# Patient Record
Sex: Male | Born: 1970 | Race: White | Hispanic: No | Smoking: Never smoker
Health system: Southern US, Community
[De-identification: ages and names within clinical notes are randomized; demographics above are authoritative.]

## PROBLEM LIST (undated history)

## (undated) DIAGNOSIS — E119 Type 2 diabetes mellitus without complications: Secondary | ICD-10-CM

## (undated) DIAGNOSIS — Z9989 Dependence on other enabling machines and devices: Secondary | ICD-10-CM

---

## 2015-08-29 ENCOUNTER — Encounter (HOSPITAL_BASED_OUTPATIENT_CLINIC_OR_DEPARTMENT_OTHER): Payer: Self-pay | Admitting: Emergency Medicine

## 2015-08-29 ENCOUNTER — Emergency Department (HOSPITAL_BASED_OUTPATIENT_CLINIC_OR_DEPARTMENT_OTHER)
Admission: EM | Admit: 2015-08-29 | Discharge: 2015-08-29 | Disposition: A | Discharge status: Home / Self Care | Payer: BLUE CROSS/BLUE SHIELD | Attending: Emergency Medicine | Admitting: Emergency Medicine

## 2015-08-29 DIAGNOSIS — H109 Unspecified conjunctivitis: Secondary | ICD-10-CM | POA: Insufficient documentation

## 2015-08-29 DIAGNOSIS — X58XXXA Exposure to other specified factors, initial encounter: Secondary | ICD-10-CM | POA: Insufficient documentation

## 2015-08-29 DIAGNOSIS — Y9289 Other specified places as the place of occurrence of the external cause: Secondary | ICD-10-CM | POA: Insufficient documentation

## 2015-08-29 DIAGNOSIS — Y998 Other external cause status: Secondary | ICD-10-CM | POA: Diagnosis not present

## 2015-08-29 DIAGNOSIS — S0502XA Injury of conjunctiva and corneal abrasion without foreign body, left eye, initial encounter: Secondary | ICD-10-CM | POA: Diagnosis not present

## 2015-08-29 DIAGNOSIS — Y9389 Activity, other specified: Secondary | ICD-10-CM | POA: Insufficient documentation

## 2015-08-29 DIAGNOSIS — S0592XA Unspecified injury of left eye and orbit, initial encounter: Secondary | ICD-10-CM | POA: Diagnosis present

## 2015-08-29 MED ORDER — POLYMYXIN B-TRIMETHOPRIM 10000-0.1 UNIT/ML-% OP SOLN: 2.0000 [drp] | Freq: Once | OPHTHALMIC | Status: DC

## 2015-08-29 MED ORDER — FLUORESCEIN SODIUM 1 MG OP STRP
1.0000 | ORAL_STRIP | Freq: Once | OPHTHALMIC | Status: DC
  Filled 2015-08-29: qty 1

## 2015-08-29 MED ORDER — TETRACAINE HCL 0.5 % OP SOLN
2.0000 [drp] | Freq: Once | OPHTHALMIC | Status: DC
Start: 2015-08-29 — End: 2015-08-29
  Filled 2015-08-29: qty 4

## 2015-08-29 MED ORDER — FLUORESCEIN SODIUM 1 MG OP STRP
ORAL_STRIP | OPHTHALMIC | Status: AC
  Filled 2015-08-29: qty 1

## 2015-08-29 MED ORDER — POLYMYXIN B-TRIMETHOPRIM 10000-0.1 UNIT/ML-% OP SOLN: 1.0000 [drp] | OPHTHALMIC | Status: AC

## 2015-08-29 NOTE — ED Provider Notes (Signed)
CSN: [id number]     Arrival date & time 08/29/15  1600 History   First MD Initiated Contact with Patient 08/29/15 1609     Chief Complaint  Patient presents with  . Eye Problem     (Consider location/radiation/quality/duration/timing/severity/associated sxs/prior Treatment) HPI  Blood pressure 125/89, pulse 89, temperature 98.6 F (37 C), temperature source Oral, resp. rate 18, height 5\' 11"  (1.803 m), weight 136.079 kg, SpO2 95 %.  Damon KleinMicheal Gomez is a 45 y.o. male complaining of bilateral eye redness and irritation worse on the left than the right associated with a prodrome of upper respiratory infection. Patient is not a contact lens wearer. He denies change in his vision, fever, pain with eye movement, trauma. States that he has been rubbing the left eye due to foreign body sensation.  History reviewed. No pertinent past medical history. History reviewed. No pertinent past surgical history. History reviewed. No pertinent family history. Social History  Substance Use Topics  . Smoking status: Never Smoker   . Smokeless tobacco: None  . Alcohol Use: Yes     Comment: occasional    Review of Systems  10 systems reviewed and found to be negative, except as noted in the HPI.  Allergies  Review of patient's allergies indicates no known allergies.  Home Medications   Prior to Admission medications   Not on File   BP 125/89 mmHg  Pulse 89  Temp(Src) 98.6 F (37 C) (Oral)  Resp 18  Ht 5\' 11"  (1.803 m)  Wt 136.079 kg  BMI 41.86 kg/m2  SpO2 95% Physical Exam  Constitutional: He is oriented to person, place, and time. He appears well-developed and well-nourished. No distress.  HENT:  Head: Normocephalic.  Eyes: EOM are normal. Pupils are equal, round, and reactive to light.    Mild bilateral conjunctival injection worse on the left than the right, no discharge. Patient has 2 areas of abnormal fluorescein uptake on the left eye as diagrammed. Extraocular movement is  intact without pain or diplopia. No lid or lash changes.  Cardiovascular: Normal rate.   Pulmonary/Chest: Effort normal. No stridor.  Musculoskeletal: Normal range of motion.  Neurological: He is alert and oriented to person, place, and time.  Psychiatric: He has a normal mood and affect.  Nursing note and vitals reviewed.   ED Course  Procedures (including critical care time) Labs Review Labs Reviewed - No data to display  Imaging Review No results found. I have personally reviewed and evaluated these images and lab results as part of my medical decision-making.   EKG Interpretation None      MDM   Final diagnoses:  Corneal abrasion, left, initial encounter  Conjunctivitis of left eye    Filed Vitals:   08/29/15 1607  BP: 125/89  Pulse: 89  Temp: 98.6 F (37 C)  TempSrc: Oral  Resp: 18  Height: 5\' 11"  (1.803 m)  Weight: 136.079 kg  SpO2: 95%    Medications  tetracaine (PONTOCAINE) 0.5 % ophthalmic solution 2 drop (not administered)  fluorescein ophthalmic strip 1 strip (not administered)  fluorescein 1 MG ophthalmic strip (not administered)  trimethoprim-polymyxin b (POLYTRIM) ophthalmic solution 2 drop (not administered)    Damon Gomez is 45 y.o. male presenting with bilateral eye redness and irritation. 2 small corneal abrasions on left eye under fluorescein stain. He is not a contact lens wearer. There is no signs of orbital or preseptal cellulitis.  Evaluation does not show pathology that would require ongoing emergent intervention or inpatient treatment.  Pt is hemodynamically stable and mentating appropriately. Discussed findings and plan with patient/guardian, who agrees with care plan. All questions answered. Return precautions discussed and outpatient follow up given.   New Prescriptions   TRIMETHOPRIM-POLYMYXIN B (POLYTRIM) OPHTHALMIC SOLUTION    Place 1 drop into both eyes every 4 (four) hours.         Wynetta Emery, PA-C 08/29/15  1637  Gwyneth Sprout, MD 08/29/15 2036

## 2015-08-29 NOTE — Discharge Instructions (Signed)
Wash your hands frequently and try to keep your hands away from the affected eye(s).   You should be feeling some improvement by 48 hours. If symptoms worsen, you develop pain, change in your vision or no improvement in 48 hours please follow with the ophthalmologist or, if that is not possible, return to the emergency room for a recheck.  Do not hesitate to return to the emergency room for any new, worsening or concerning symptoms.  Please obtain primary care using resource guide below. Let them know that you were seen in the emergency room and that they will need to obtain records for further outpatient management.    Corneal Abrasion The cornea is the clear covering at the front and center of the eye. When looking at the colored portion of the eye (iris), you are looking through the cornea. This very thin tissue is made up of many layers. The surface layer is a single layer of cells (corneal epithelium) and is one of the most sensitive tissues in the body. If a scratch or injury causes the corneal epithelium to come off, it is called a corneal abrasion. If the injury extends to the tissues below the epithelium, the condition is called a corneal ulcer. CAUSES   Scratches.  Trauma.  Foreign body in the eye. Some people have recurrences of abrasions in the area of the original injury even after it has healed (recurrent erosion syndrome). Recurrent erosion syndrome generally improves and goes away with time. SYMPTOMS   Eye pain.  Difficulty or inability to keep the injured eye open.  The eye becomes very sensitive to light.  Recurrent erosions tend to happen suddenly, first thing in the morning, usually after waking up and opening the eye. DIAGNOSIS  Your health care provider can diagnose a corneal abrasion during an eye exam. Dye is usually placed in the eye using a drop or a small paper strip moistened by your tears. When the eye is examined with a special light, the abrasion shows up  clearly because of the dye. TREATMENT   Small abrasions may be treated with antibiotic drops or ointment alone.  A pressure patch may be put over the eye. If this is done, follow your doctor's instructions for when to remove the patch. Do not drive or use machines while the eye patch is on. Judging distances is hard to do with a patch on. If the abrasion becomes infected and spreads to the deeper tissues of the cornea, a corneal ulcer can result. This is serious because it can cause corneal scarring. Corneal scars interfere with light passing through the cornea and cause a loss of vision in the involved eye. HOME CARE INSTRUCTIONS  Use medicine or ointment as directed. Only take over-the-counter or prescription medicines for pain, discomfort, or fever as directed by your health care provider.  Do not drive or operate machinery if your eye is patched. Your ability to judge distances is impaired.  If your health care provider has given you a follow-up appointment, it is very important to keep that appointment. Not keeping the appointment could result in a severe eye infection or permanent loss of vision. If there is any problem keeping the appointment, let your health care provider know. SEEK MEDICAL CARE IF:   You have pain, light sensitivity, and a scratchy feeling in one eye or both eyes.  Your pressure patch keeps loosening up, and you can blink your eye under the patch after treatment.  Any kind of discharge develops  from the eye after treatment or if the lids stick together in the morning.  You have the same symptoms in the morning as you did with the original abrasion days, weeks, or months after the abrasion healed.   This information is not intended to replace advice given to you by your health care provider. Make sure you discuss any questions you have with your health care provider.   Document Released: 08/05/2000 Document Revised: 04/29/2015 Document Reviewed: 04/15/2013 Elsevier  Interactive Patient Education 2016 [employer]  Emergency Department Resource Guide 1) Find a Doctor and Pay Out of Pocket Although you won't have to find out who is covered by your insurance plan, it is a good idea to ask around and get recommendations. You will then need to call the office and see if the doctor you have chosen will accept you as a new patient and what types of options they offer for patients who are self-pay. Some doctors offer discounts or will set up payment plans for their patients who do not have insurance, but you will need to ask so you aren't surprised when you get to your appointment.  2) Contact Your Local Health Department Not all health departments have doctors that can see patients for sick visits, but many do, so it is worth a call to see if yours does. If you don't know where your local health department is, you can check in your phone book. The CDC also has a tool to help you locate your state's health department, and many state websites also have listings of all of their local health departments.  3) Find a Walk-in Clinic If your illness is not likely to be very severe or complicated, you may want to try a walk in clinic. These are popping up all over the country in pharmacies, drugstores, and shopping centers. They're usually staffed by nurse practitioners or physician assistants that have been trained to treat common illnesses and complaints. They're usually fairly quick and inexpensive. However, if you have serious medical issues or chronic medical problems, these are probably not your best option.  No Primary Care Doctor: - Call Health Connect at  [phone] - they can help you locate a primary care doctor that  accepts your insurance, provides certain services, etc. - Physician Referral Service- [phone]  Chronic Pain Problems: Organization         Address  Phone   Notes  [facility]  [phone] Patients need to be referred by  their primary care doctor.   Medication Assistance: Organization         Address  Phone   Notes  [facility] [place] Medication [facility] [address] [place], [place] [address] [phone] --Must be a resident of [place] -- Must have NO insurance coverage whatsoever (no Medicaid/ Medicare, etc.) -- The pt. MUST have a primary care doctor that directs their care regularly and follows them in the community   MedAssist  [phone]   [employer]  [phone]    Agencies that provide inexpensive medical care: Organization         Address  Phone   Notes  [facility]  [phone]   [facility]    [phone]   [facility] [address] [place], [place] [address] [phone]   [facility] [address], [place] [phone]   [facility]anned Parenthood    [phone]   [facility]    [phone]   [facility] and [facility] [place]  [address], [place] Phone:  [phone], Fax:  [phone] Hours of Operation:  9 am - 6 pm, M-F.  Also accepts Medicaid/Medicare and self-pay.  [facility]  [address],  Phone: [phone], Fax: [phone]. Hours of Operation:  8:30 am - 5:30 pm, M-F.  Also accepts Medicaid and self-pay.  [facility] [place] [address] [place] Phone: [phone]   [facility] [address] Natasha Bence [place], [place] [phone], Ext. [phone] Mondays & Thursdays: 7-9 AM.  First 15 patients are seen on a first come, first serve basis.    Medicaid-accepting [facility] Providers:  Organization         Address  Phone   Notes  [facility] [address],  [phone] Also accepts self-pay patients.  [facility] [address] Laurell Josephs [place], [place]  [phone]   [facility]  [address], [place] [phone]   [facility] [address] [place] [phone]   Renaye Rakers [address], [place]   [phone] Only accepts [place] Access [place] patients after they have their name applied to their card.   Self-Pay (no insurance) in [facility]:  Organization         Address  Phone   Notes  Sickle Cell Patients, [facility] [address] [place], [place] [phone]   [facility] [address] [place], [place] [phone]   [facility]  [address], [place] [phone]   [facility]/Dr. Osei-Bonsu  [address] [place] or [address], [place] [phone] Phone number for both [place] and [place] locations is the same.  Urgent Medical and [facility] [address] [place] [phone]   [facility] [address], [place] or [address] [phone]   [facility] [address] [place] [phone], phone; [phone], fax Sees patients 1st and 3rd Saturday of every month.  Must not qualify for public or private insurance (i.e. Medicaid, Medicare, [place] Health Choice, Veterans' Benefits)  Household income should be no more than 200% of the poverty level The clinic cannot treat you if you are pregnant or think you are pregnant  Sexually transmitted diseases are not treated at the clinic.    Dental Care: Organization         Address  Phone  Notes  [facility] Department of [facility] [address] [place], [place] [phone] Accepts children up to age 68 who are enrolled in [place] or [place] Health Choice; pregnant women with a Medicaid card; and children who have applied for Medicaid or [place] Health Choice, but were declined, whose parents can pay a reduced  fee at time of service.  [facility] Department of [facility]  [address], [place] [phone] Accepts children up to age 62 who are enrolled in [place] or [place] Health Choice; pregnant women with a Medicaid card; and children who have applied for Medicaid or  Health Choice, but were declined, whose parents can pay a reduced  fee at time of service.  [facility]  [address] [place], [place] [phone] Patients are seen by appointment only. Walk-ins are not accepted. [facility] will see patients 37 years of age and older. Monday - Tuesday (8am-5pm) Most Wednesdays (8:30-5pm) $30 per visit, cash only  [facility] Dental Access PRO[facility]  [address], [facility] [phone] Patients are seen by appointment only. Walk-ins are not accepted. [facility] will see patients 32 years of age and older. One Wednesday Evening (Monthly: Volunteer Based).  $30 per visit, cash only  [employer]  [phone] for adults; Children under age 25, call Graduate Pediatric Dentistry at [phone]. Children aged 11-14, please call [phone] to request a pediatric application.  Dental services are provided in all areas of dental care including fillings, crowns and bridges, complete and partial dentures, implants, gum treatment, root canals, and extractions. Preventive care is also provided. Treatment is provided to both adults and children. Patients are selected via a lottery and there is often a waiting list.   [facility] [address], [place]  [phone] [email or web address]   [facility] [address] [place], [place] [phone], Ext. [phone] Second and Fourth Thursday of each month, opens at 6:30 AM; Clinic ends at 9 AM.  Patients are seen on a first-come first-served basis, and a limited number are seen during each clinic.   [facility]  [address] Ether Griffins [place], [place] [phone]   Eligibility Requirements You must have lived in [place], [place], or [place] counties for at least the last three months.   You cannot be eligible for state or federal sponsored [place], including [employer], [place], or [employer].   You generally cannot be eligible for healthcare insurance through your employer.    How to apply: Eligibility screenings are held every Tuesday and Wednesday afternoon from 1:00 pm until 4:00 pm. You do not need an appointment for the interview!  [facility] [address] [place], [place] [phone]   [facility]  [phone]   [facility]  [phone]   [facility] Health Department  [phone]    Behavioral Health Resources in the Community: Intensive Outpatient Programs Organization         Address  Phone  Notes  [facility] [address], [place], [place] [phone]   [facility] Outpatient [address] [place], [place] [phone]   [facility] [address] [place], [place]  [phone]   [facility] Mental Health [address]  [place], [place] [phone] or [phone]   Substance Abuse Resources Organization         Address  Phone  Notes  Alcohol [facility] Drug Services  [phone]   [facility]  [phone]   [facility] [place]  [phone]   Floydene Flock  [phone]   Residential & Outpatient Substance Abuse Program  [phone]   Psychological Services Organization         Address  Phone  Notes  [facility] Behavioral Health  [phone]   [facility]  [phone]   [facility] Health [address] [place] [phone] or [phone]    Mobile Crisis Teams Organization         Address  Phone  Notes  [facility], Mobile Crisis Care Unit  [phone]  [facility] Psychotherapeutic  Services  [address] [place], [place] [phone]   [facility] [address] [place] [phone]    Self-Help/Support Groups Organization         Address  Phone             Notes  [facility] [place] - variety of support groups  [phone]- I7437963 Call for more information  Narcotics Anonymous (NA), Caring [facility] [address], [employer] [place]  2 meetings at this location   Statistician         Address  Phone  Notes  [facility] [address],    [place]  [phone]   [facility]  [address], [place] [address], [place], [place] [phone]   [facility] [address] [place], [place] [phone] Admissions: 8am-3pm M-F  [facility] [address],    [place], [place] [phone]   [facility] [address] [place], [place], [place] [phone]   [facility] [address]  [place], [place] [phone]   [facility] - Intensive Outpatient [address] Laurell Josephs [address], [place], [place] [phone]   [facility] ([facility]iction [facility] [address] [place]  [place], [place] [phone] or [phone]   [facility] ([facility]) [address], [place], [place] [phone] Accepts Medicaid  [facility] [address],  [place] [phone] Substance Abuse/Addiction Treatment   [facility] Organization         Address  Phone  Notes  [facility]  [phone]   Angie Fava, PhD [address] Ervin Knack [place], [place]   [phone] or [phone]   [facility] Behavioral   [address] [place], [place] [phone]   [facility] [address] [place], [place] [phone] Insurance/Medicaid/sponsorship through [facility] and Families [address]                                    [place], [place] [phone] Therapy/tele-psych/case  [facility] [address], [place] [phone]    Dr. Lolly Mustache  [phone]   [facility]  [employer] [facility] Dept. 1) [address] [place] 2) [address] [place] 3)  [address], [place] [phone]  [phone]   [facility] Child Abuse Hotline 519-325-6226 or [phone] (After Hours)

## 2015-08-29 NOTE — ED Notes (Signed)
Pt in c/o eye redness and drainage onset yesterday.

## 2015-10-16 ENCOUNTER — Emergency Department (INDEPENDENT_AMBULATORY_CARE_PROVIDER_SITE_OTHER): Payer: BLUE CROSS/BLUE SHIELD

## 2015-10-16 ENCOUNTER — Emergency Department
Admission: EM | Admit: 2015-10-16 | Discharge: 2015-10-16 | Disposition: A | Discharge status: Home / Self Care | Payer: BLUE CROSS/BLUE SHIELD | Source: Home / Self Care | Attending: Family Medicine | Admitting: Family Medicine

## 2015-10-16 DIAGNOSIS — R1013 Epigastric pain: Secondary | ICD-10-CM

## 2015-10-16 DIAGNOSIS — R142 Eructation: Secondary | ICD-10-CM

## 2015-10-16 HISTORY — DX: Dependence on other enabling machines and devices: Z99.89

## 2015-10-16 MED ORDER — OMEPRAZOLE 20 MG PO CPDR: 20.0000 mg | DELAYED_RELEASE_CAPSULE | Freq: Every day | ORAL | Status: DC

## 2015-10-16 MED ORDER — OMEPRAZOLE 20 MG PO CPDR: 20.0000 mg | DELAYED_RELEASE_CAPSULE | Freq: Every day | ORAL | Status: AC

## 2015-10-16 NOTE — ED Provider Notes (Signed)
CSN: [id number]     Arrival date & time 10/16/15  1453 History   First MD Initiated Contact with Patient 10/16/15 1502     Chief Complaint  Patient presents with  . Abdominal Pain   (Consider location/radiation/quality/duration/timing/severity/associated sxs/prior Treatment) HPI  The pt is a 45yo male presenting to [facility] with c/o epigastric abdominal pain that is cramping 5/10 at worst, occasionally goes into his chest.  Symptoms started about 5 days ago, associated generalized headache that only lasted on the first day with mild intermittent dizziness.  He also reports bilateral mid back pain that was aching last night but gone today.  He has been belching more but has had normal bowel movements.  He was in [place] and just returned about 3 weeks ago. He did have a day of nausea and vomiting when he was over there but none when he returned.  He also notes his wife felt bad 5 days ago when he developed headaches and felt bad but they thought they both had a virus as she is feeling better.  He has tried Tums w/o relief. Denies fever, chills, or diarrhea. No hx of abdominal surgeries. Denies urinary symptoms. Denies chest pain or SOB at this time. Denies hx of similar symptoms. He is not on antiacids regularly.  He is suppose to take Vitamin D but no other daily medications.  Past Medical History  Diagnosis Date  . CPAP (continuous positive airway pressure) dependence    History reviewed. No pertinent past surgical history. No family history on file. Social History  Substance Use Topics  . Smoking status: Never Smoker   . Smokeless tobacco: None  . Alcohol Use: Yes     Comment: occasional    Review of Systems  Constitutional: Negative for fever and chills.  HENT: Negative for congestion, ear pain, sore throat, trouble swallowing and voice change.   Respiratory: Negative for cough and shortness of breath.   Cardiovascular: Positive for chest pain. Negative for palpitations.   Gastrointestinal: Positive for nausea, vomiting and abdominal pain (epigastric). Negative for diarrhea.  Genitourinary: Positive for flank pain ( bilateral). Negative for dysuria, urgency, frequency, hematuria, decreased urine volume and penile pain.  Musculoskeletal: Negative for myalgias, back pain and arthralgias.  Skin: Negative for rash.  Neurological: Positive for headaches. Negative for dizziness and light-headedness.    Allergies  Review of patient's allergies indicates no known allergies.  Home Medications   Prior to Admission medications   Medication Sig Start Date End Date Taking? Authorizing Provider  omeprazole (PRILOSEC) 20 MG capsule Take 1 capsule (20 mg total) by mouth daily. For up to 4 weeks 10/16/15   Junius Finner, PA-C  trimethoprim-polymyxin b (POLYTRIM) ophthalmic solution Place 1 drop into both eyes every 4 (four) hours. 08/29/15   Joni Reining Pisciotta, PA-C   Meds Ordered and Administered this Visit  Medications - No data to display  BP 133/83 mmHg  Pulse 88  Temp(Src) 97.8 F (36.6 C) (Oral)  Ht  (1.803 m)  Wt 316 lb 1.9 oz (143.391 kg)  BMI 44.11 kg/m2  SpO2 97% No data found.   Physical Exam  Constitutional: He appears well-developed and well-nourished. No distress.  Pt sitting on exam bed, appears well. NAD  HENT:  Head: Normocephalic and atraumatic.  Nose: Nose normal.  Mouth/Throat: Uvula is midline, oropharynx is clear and moist and mucous membranes are normal.  Eyes: Conjunctivae are normal. No scleral icterus.  Neck: Normal range of motion.  Cardiovascular: Normal rate, regular rhythm  and normal heart sounds.   Pulmonary/Chest: Effort normal and breath sounds normal. No respiratory distress. He has no wheezes. He has no rales. He exhibits no tenderness.  Abdominal: Soft. Bowel sounds are normal. He exhibits no distension and no mass. There is tenderness. There is no rebound, no guarding and no CVA tenderness.  Obese abdomen, soft,  tenderness to epigastrium and periumbilical region w/o rebound, guarding or masses.  Musculoskeletal: Normal range of motion.  Neurological: He is alert.  Skin: Skin is warm and dry. He is not diaphoretic.  Nursing note and vitals reviewed.   ED Course  Procedures (including critical care time)  Labs Review Labs Reviewed  COMPLETE METABOLIC PANEL WITH GFR  LIPASE  CBC    Imaging Review Dg Chest 2 View  10/16/2015  CLINICAL DATA:  Pt states that for the past 3 weeks he has had had bi-lateral rib pain. Denies cough, sob or cold symptoms. Recent trip to [place] 3 weeks ago. EXAM: CHEST - 2 VIEW COMPARISON:  None available FINDINGS: Lungs are clear. Heart size and mediastinal contours are within normal limits. No effusion. Visualized skeletal structures are unremarkable. IMPRESSION: No acute cardiopulmonary disease. Electronically Signed   By: Corlis Leak M.D.   On: 10/16/2015 16:13   Dg Abd 2 Views  10/16/2015  CLINICAL DATA:  Epigastric pain the past 3 weeks.  Nausea, vomiting EXAM: ABDOMEN - 2 VIEW COMPARISON:  None. FINDINGS: The bowel gas pattern is normal. There is no evidence of free air. No radio-opaque calculi or other significant radiographic abnormality is seen. IMPRESSION: Negative. Electronically Signed   By: Charlett Nose M.D.   On: 10/16/2015 16:00      MDM   1. Epigastric abdominal pain   2. Belching    Pt c/o 5 days of epigastric pain and extra belching.  Recent travel to [place] about 3 weeks ago with nausea and vomiting for 1 day there but none since returning.    Epigastric and mid-abdominal tenderness on exam w/o rebound or guarding.  EKG: unremarkable CXR and Abd: no acute findings.   Doubt SBO, ACS, or other emergent process taking place at this time. Labs: CBC, CMP, and Lipase pending  Encouraged to f/u with PCP in 1 week if not improving, sooner if worsening. Discussed symptoms that warrant emergent care in the ED. Patient verbalized understanding and  agreement with treatment plan.     Junius Finner, PA-C 10/16/15 1706

## 2015-10-16 NOTE — ED Notes (Signed)
Pt was in [place] 2-3 weeks ago.  While there he became sick with N&V.  Complaining of epigastric pain, pain in the right shoulder on Monday, and lateral mid back pain on the right.  Denies arm pain, jaw pain, or racing heartrate.

## 2015-10-17 LAB — CBC
HCT: 40.9 % (ref 39.0–52.0)
Hemoglobin: 13.4 g/dL (ref 13.0–17.0)
MCH: 26.3 pg (ref 26.0–34.0)
MCHC: 32.8 g/dL (ref 30.0–36.0)
MCV: 80.2 fL (ref 78.0–100.0)
MPV: 9.8 fL (ref 8.6–12.4)
Platelets: 281 10*3/uL (ref 150–400)
RBC: 5.1 MIL/uL (ref 4.22–5.81)
RDW: 13.7 % (ref 11.5–15.5)
WBC: 8 10*3/uL (ref 4.0–10.5)

## 2015-10-17 LAB — COMPLETE METABOLIC PANEL WITH GFR
ALT: 34 U/L (ref 9–46)
AST: 26 U/L (ref 10–40)
Albumin: 4 g/dL (ref 3.6–5.1)
Alkaline Phosphatase: 56 U/L (ref 40–115)
BUN: 13 mg/dL (ref 7–25)
CO2: 28 mmol/L (ref 20–31)
Calcium: 9.1 mg/dL (ref 8.6–10.3)
Chloride: 104 mmol/L (ref 98–110)
Creat: 1.09 mg/dL (ref 0.60–1.35)
GFR, Est African American: >89 mL/min (ref >=60)
GFR, Est Non African American: 82 mL/min (ref >=60)
Glucose, Bld: 117 mg/dL — ABNORMAL HIGH (ref 65–99)
Potassium: 4.4 mmol/L (ref 3.5–5.3)
Sodium: 139 mmol/L (ref 135–146)
Total Bilirubin: 0.6 mg/dL (ref 0.2–1.2)
Total Protein: 6.9 g/dL (ref 6.1–8.1)

## 2015-10-20 ENCOUNTER — Telehealth: Payer: Self-pay | Admitting: *Deleted

## 2016-01-26 ENCOUNTER — Other Ambulatory Visit (HOSPITAL_COMMUNITY): Payer: Self-pay | Admitting: General Surgery

## 2016-01-26 DIAGNOSIS — Z933 Colostomy status: Secondary | ICD-10-CM

## 2020-01-22 MED FILL — VIT D2 1.25 MG (50,000 UNIT: 1.25 MG | 84 days supply | Qty: 12 | Fill #0

## 2020-03-04 MED FILL — BENZONATATE 200 MG CAPS: 200 | 7 days supply | Qty: 20 | Fill #0

## 2020-03-06 ENCOUNTER — Other Ambulatory Visit (HOSPITAL_BASED_OUTPATIENT_CLINIC_OR_DEPARTMENT_OTHER): Payer: Self-pay | Admitting: Internal Medicine

## 2020-03-06 MED FILL — METFORMIN HCL 500 MG TABS: 500 | 30 days supply | Qty: 30 | Fill #0

## 2020-03-17 MED FILL — OZEMPIC 0.25 OR 0.5 MG/DOSE: 2 | 42 days supply | Qty: 2 | Fill #0

## 2020-04-08 MED FILL — VIT D2 1.25 MG (50,000 UNIT: 1.25 MG | 21 days supply | Qty: 3 | Fill #1

## 2020-05-05 MED FILL — OZEMPIC 0.25 OR 0.5 MG/DOSE: 2 | 42 days supply | Qty: 2 | Fill #1

## 2020-06-10 MED FILL — OZEMPIC 0.25 OR 0.5 MG/DOSE: 2 | 30 days supply | Qty: 2 | Fill #2

## 2020-06-11 ENCOUNTER — Other Ambulatory Visit (HOSPITAL_BASED_OUTPATIENT_CLINIC_OR_DEPARTMENT_OTHER): Payer: Self-pay | Admitting: Internal Medicine

## 2020-07-03 MED FILL — OZEMPIC 0.25 OR 0.5 MG/DOSE: 2 | 28 days supply | Qty: 2 | Fill #0

## 2020-07-22 ENCOUNTER — Other Ambulatory Visit (HOSPITAL_BASED_OUTPATIENT_CLINIC_OR_DEPARTMENT_OTHER): Payer: Self-pay | Admitting: Internal Medicine

## 2020-07-28 ENCOUNTER — Other Ambulatory Visit (HOSPITAL_BASED_OUTPATIENT_CLINIC_OR_DEPARTMENT_OTHER): Payer: Self-pay | Admitting: Internal Medicine

## 2020-07-28 MED FILL — OZEMPIC 0.25 OR 0.5 MG/DOSE: 2 | 84 days supply | Qty: 5 | Fill #0

## 2020-07-28 MED FILL — VIT D2 1.25 MG (50,000 UNIT: 1.25 MG | 28 days supply | Qty: 4 | Fill #0

## 2020-08-26 MED FILL — VIT D2 1.25 MG (50,000 UNIT: 1.25 MG | 28 days supply | Qty: 4 | Fill #1

## 2020-09-25 MED FILL — VIT D2 1.25 MG (50,000 UNIT: 1.25 MG | 28 days supply | Qty: 4 | Fill #2

## 2020-10-28 MED FILL — VIT D2 1.25 MG (50,000 UNIT: 1.25 MG | 21 days supply | Qty: 3 | Fill #3

## 2020-11-04 ENCOUNTER — Other Ambulatory Visit (HOSPITAL_BASED_OUTPATIENT_CLINIC_OR_DEPARTMENT_OTHER): Payer: Self-pay | Admitting: Internal Medicine

## 2021-02-08 ENCOUNTER — Other Ambulatory Visit (HOSPITAL_BASED_OUTPATIENT_CLINIC_OR_DEPARTMENT_OTHER): Payer: Self-pay

## 2021-02-08 MED ORDER — OZEMPIC (0.25 OR 0.5 MG/DOSE) 2 MG/1.5ML ~~LOC~~ SOPN
PEN_INJECTOR | SUBCUTANEOUS | 2 refills | Status: AC
  Filled 2021-02-08: qty 4.5, 84d supply, fill #0

## 2021-02-10 ENCOUNTER — Other Ambulatory Visit (HOSPITAL_BASED_OUTPATIENT_CLINIC_OR_DEPARTMENT_OTHER): Payer: Self-pay

## 2021-02-15 ENCOUNTER — Other Ambulatory Visit (HOSPITAL_BASED_OUTPATIENT_CLINIC_OR_DEPARTMENT_OTHER): Payer: Self-pay

## 2021-02-15 MED ORDER — VALACYCLOVIR HCL 1 G PO TABS
ORAL_TABLET | ORAL | 0 refills | Status: AC
  Filled 2021-02-15: qty 14, 7d supply, fill #0

## 2021-02-17 ENCOUNTER — Other Ambulatory Visit (HOSPITAL_BASED_OUTPATIENT_CLINIC_OR_DEPARTMENT_OTHER): Payer: Self-pay

## 2021-02-17 MED ORDER — VITAMIN D (ERGOCALCIFEROL) 1.25 MG (50000 UNIT) PO CAPS
ORAL_CAPSULE | ORAL | 2 refills | Status: DC
  Filled 2021-02-17: qty 5, 35d supply, fill #0
  Filled 2021-03-24: qty 5, 35d supply, fill #1
  Filled 2021-04-22: qty 5, 35d supply, fill #2

## 2021-03-24 ENCOUNTER — Other Ambulatory Visit (HOSPITAL_BASED_OUTPATIENT_CLINIC_OR_DEPARTMENT_OTHER): Payer: Self-pay

## 2021-04-22 ENCOUNTER — Other Ambulatory Visit (HOSPITAL_BASED_OUTPATIENT_CLINIC_OR_DEPARTMENT_OTHER): Payer: Self-pay

## 2021-04-29 ENCOUNTER — Emergency Department (INDEPENDENT_AMBULATORY_CARE_PROVIDER_SITE_OTHER): Payer: BC Managed Care – PPO

## 2021-04-29 ENCOUNTER — Emergency Department
Admission: EM | Admit: 2021-04-29 | Discharge: 2021-04-29 | Disposition: A | Discharge status: Home / Self Care | Payer: BLUE CROSS/BLUE SHIELD | Source: Home / Self Care

## 2021-04-29 ENCOUNTER — Encounter: Payer: Self-pay | Admitting: Emergency Medicine

## 2021-04-29 ENCOUNTER — Other Ambulatory Visit: Payer: Self-pay

## 2021-04-29 DIAGNOSIS — M25561 Pain in right knee: Secondary | ICD-10-CM | POA: Diagnosis not present

## 2021-04-29 HISTORY — DX: Type 2 diabetes mellitus without complications: E11.9

## 2021-04-29 MED ORDER — CELECOXIB 100 MG PO CAPS: 100.0000 mg | ORAL_CAPSULE | Freq: Two times a day (BID) | ORAL | 0 refills | Status: AC

## 2021-04-29 NOTE — Discharge Instructions (Addendum)
Advised patient to take medication as directed with food to completion.  Advised patient of [place] orthopedic provider contact information provided above if symptoms worsen and/or unresolved please follow-up with Dr. Noreene Filbert.

## 2021-04-29 NOTE — ED Triage Notes (Signed)
Patient here for pain in right knee; no injury; happened "few months ago'' and resolved without evaluation; current episode for 2 weeks. States feels as though knee cap is movable; has tried acetaminophen and ibuprofen and ice without total relief. Suspects arthritis. Has had covid vaccinations.

## 2021-04-29 NOTE — ED Provider Notes (Signed)
[facility]    CSN: [id number] Arrival date & time: 04/29/21  1559      History   Chief Complaint Chief Complaint  Patient presents with   Knee Pain    HPI Dam[facility] Gomez is a 50 y.o. male.   HPI 50 year old male presents with right knee pain for 2 weeks.  Past Medical History:  Diagnosis Date   CPAP (continuous positive airway pressure) dependence    Diabetes mellitus without complication (HCC)     There are no problems to display for this patient.   History reviewed. No pertinent surgical history.     Home Medications    Prior to Admission medications   Medication Sig Start Date End Date Taking? Authorizing Provider  celecoxib (CELEBREX) 100 MG capsule Take 1 capsule (100 mg total) by mouth 2 (two) times daily for 15 days. 04/29/21 05/14/21 Yes Trevor Iha, FNP  cetirizine (ZYRTEC) 10 MG tablet Take 10 mg by mouth daily.   Yes [provider]  omeprazole (PRILOSEC) 20 MG capsule Take 1 capsule (20 mg total) by mouth daily. For up to 4 weeks 10/16/15   Lurene Shadow, PA-C  Semaglutide,0.25 or 0.5MG /DOS, (OZEMPIC, 0.25 OR 0.5 MG/DOSE,) 2 MG/1.5ML SOPN Inject 0.5mg  under the skin once a week 02/08/21     Semaglutide,0.25 or 0.5MG /DOS, 2 MG/1.5ML SOPN INJECT 0.5 MG UNDER SKIN ONCE A WEEK 11/04/20 11/04/21  Paruchuri, Janace Hoard, MD  Semaglutide,0.25 or 0.5MG /DOS, 2 MG/1.5ML SOPN INJECT 0.5 MG UNDER SKIN ONCE A WEEK 07/22/20 07/22/21  Paruchuri, Janace Hoard, MD  Semaglutide,0.25 or 0.5MG /DOS, 2 MG/1.5ML SOPN INJECT 0.5MG  UNDER THE SKIN ONCE A WEEK 06/11/20 06/11/21  Paruchuri, Janace Hoard, MD  trimethoprim-polymyxin b (POLYTRIM) ophthalmic solution Place 1 drop into both eyes every 4 (four) hours. 08/29/15   Pisciotta, Joni Reining, PA-C  valACYclovir (VALTREX) 1000 MG tablet Take 1 tablet (1,000 mg total) by mouth in the morning and 1 tablet (1,000 mg total) in the evening. Do all this for 7 days. 02/15/21     Vitamin D, Ergocalciferol, (DRISDOL) 1.25 MG (50000  UNIT) CAPS capsule TAKE 1 CAPSULE BY MOUTH ONCE A WEEK 07/28/20 07/28/21  Paruchuri, Janace Hoard, MD  Vitamin D, Ergocalciferol, (DRISDOL) 1.25 MG (50000 UNIT) CAPS capsule Take 1 (one) Capsule by mouth weekly 02/17/21       Family History No family history on file.  Social History Social History   Tobacco Use   Smoking status: Never  Substance Use Topics   Alcohol use: Yes    Comment: occasional     Allergies   Patient has no known allergies.   Review of Systems Review of Systems  Musculoskeletal:        Knee pain x 2 weeks    Physical Exam Triage Vital Signs ED Triage Vitals  Enc Vitals Group     BP 04/29/21 1638 (!) 143/91     Pulse Rate 04/29/21 1638 79     Resp 04/29/21 1638 16     Temp 04/29/21 1638 99 F (37.2 C)     Temp Source 04/29/21 1638 Oral     SpO2 04/29/21 1638 98 %     Weight 04/29/21 1642 (!) 340 lb (154.2 kg)     Height 04/29/21 1642 5\' 11"  (1.803 m)     Head Circumference --      Peak Flow --      Pain Score 04/29/21 1642 5     Pain Loc --      Pain  Edu? --      Excl. in GC? --    No data found.  Updated Vital Signs BP (!) 143/91 (BP Location: Right Arm)   Pulse 79   Temp 99 F (37.2 C) (Oral)   Resp 16   Ht 5\' 11"  (1.803 m)   Wt (!) 340 lb (154.2 kg)   SpO2 98%   BMI 47.42 kg/m      Physical Exam Vitals and nursing note reviewed.  Constitutional:      General: He is not in acute distress.    Appearance: He is obese. He is not ill-appearing.  HENT:     Head: Normocephalic and atraumatic.     Mouth/Throat:     Mouth: Mucous membranes are moist.     Pharynx: Oropharynx is clear.  Eyes:     Extraocular Movements: Extraocular movements intact.     Conjunctiva/sclera: Conjunctivae normal.     Pupils: Pupils are equal, round, and reactive to light.  Cardiovascular:     Rate and Rhythm: Normal rate and regular rhythm.     Pulses: Normal pulses.     Heart sounds: Normal heart sounds.  Pulmonary:     Effort: Pulmonary effort is  normal.     Breath sounds: No wheezing, rhonchi or rales.  Musculoskeletal:        General: No deformity.     Cervical back: Normal range of motion and neck supple.     Comments: Right knee: TTP over superior medial border of patella, positive Lachman's, negative anterior drawer, mild pain elicited with minimal varus/valgus stress, no deformity noted, no soft tissue swelling  Skin:    General: Skin is warm and dry.  Neurological:     General: No focal deficit present.     Mental Status: He is oriented to person, place, and time. Mental status is at baseline.  Psychiatric:        Mood and Affect: Mood normal.        Behavior: Behavior normal.        Thought Content: Thought content normal.     UC Treatments / Results  Labs (all labs ordered are listed, but only abnormal results are displayed) Labs Reviewed - No data to display  EKG   Radiology DG Knee Complete 4 Views Right  Result Date: 04/29/2021 CLINICAL DATA:  Right knee pain.  No known injury. EXAM: RIGHT KNEE - COMPLETE 4+ VIEW COMPARISON:  None. FINDINGS: No evidence of fracture, dislocation, or joint effusion. Mild medial tibiofemoral joint space narrowing. Minimal spurring of the tibial spines and medial tibiofemoral compartment. No erosion or focal bone abnormality. Soft tissues are unremarkable. 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 No acute findings. Electronically Signed   By: 06/29/2021 M.D.   On: 04/29/2021 18:34    Procedures Procedures (including critical care time)  Medications Ordered in UC Medications - No data to display  Initial Impression / Assessment and Plan / UC Course  I have reviewed the triage vital signs and the nursing notes.  Pertinent labs & imaging results that were available during my care of the patient were reviewed by me and considered in my medical decision making (see chart for details).     MDM: 1.  Right knee pain-knee x-ray  revealed mild degenerative changes most prominent in the medial tibiofemoral compartment, no acute findings, Rx'd Celebrex.  Advised patient to take medication as directed with food to completion.  Advised patient of [place]  orthopedic provider contact information provided above if symptoms worsen and/or unresolved please follow-up with Dr. Noreene Filbert. Patient discharged home, hemodynamically stable. Final Clinical Impressions(s) / UC Diagnoses   Final diagnoses:  Acute pain of right knee     Discharge Instructions      Advised patient to take medication as directed with food to completion.  Advised patient of [place] orthopedic provider contact information provided above if symptoms worsen and/or unresolved please follow-up with Dr. Noreene Filbert.     ED Prescriptions     Medication Sig Dispense Auth. Provider   celecoxib (CELEBREX) 100 MG capsule Take 1 capsule (100 mg total) by mouth 2 (two) times daily for 15 days. 30 capsule Trevor Iha, FNP      PDMP not reviewed this encounter.   Levis, Nazir, FNP 04/29/21 [phone]

## 2021-05-06 ENCOUNTER — Other Ambulatory Visit (HOSPITAL_BASED_OUTPATIENT_CLINIC_OR_DEPARTMENT_OTHER): Payer: Self-pay

## 2021-05-06 MED ORDER — OZEMPIC (0.25 OR 0.5 MG/DOSE) 2 MG/1.5ML ~~LOC~~ SOPN
0.5000 mg | PEN_INJECTOR | SUBCUTANEOUS | 2 refills | Status: AC
  Filled 2021-05-06: qty 1.5, 28d supply, fill #0
  Filled 2021-05-31: qty 1.5, 28d supply, fill #1
  Filled 2021-06-27: qty 1.5, 28d supply, fill #2

## 2021-05-06 MED ORDER — VITAMIN D (ERGOCALCIFEROL) 1.25 MG (50000 UNIT) PO CAPS
50000.0000 [IU] | ORAL_CAPSULE | ORAL | 2 refills | Status: AC
  Filled 2021-05-06 – 2021-06-02 (×2): qty 5, 35d supply, fill #0
  Filled 2021-07-12: qty 5, 35d supply, fill #1
  Filled 2021-09-04: qty 5, 35d supply, fill #2

## 2021-05-07 ENCOUNTER — Other Ambulatory Visit (HOSPITAL_BASED_OUTPATIENT_CLINIC_OR_DEPARTMENT_OTHER): Payer: Self-pay

## 2021-05-31 ENCOUNTER — Other Ambulatory Visit (HOSPITAL_BASED_OUTPATIENT_CLINIC_OR_DEPARTMENT_OTHER): Payer: Self-pay

## 2021-06-02 ENCOUNTER — Other Ambulatory Visit (HOSPITAL_BASED_OUTPATIENT_CLINIC_OR_DEPARTMENT_OTHER): Payer: Self-pay

## 2021-06-28 ENCOUNTER — Other Ambulatory Visit (HOSPITAL_BASED_OUTPATIENT_CLINIC_OR_DEPARTMENT_OTHER): Payer: Self-pay

## 2021-07-12 ENCOUNTER — Other Ambulatory Visit (HOSPITAL_BASED_OUTPATIENT_CLINIC_OR_DEPARTMENT_OTHER): Payer: Self-pay

## 2021-08-05 ENCOUNTER — Other Ambulatory Visit (HOSPITAL_BASED_OUTPATIENT_CLINIC_OR_DEPARTMENT_OTHER): Payer: Self-pay

## 2021-08-05 MED ORDER — MOUNJARO 5 MG/0.5ML ~~LOC~~ SOAJ
SUBCUTANEOUS | 2 refills | Status: AC
  Filled 2021-08-05: qty 2, 28d supply, fill #0
  Filled 2021-09-04: qty 2, 28d supply, fill #1
  Filled 2021-10-12: qty 2, 28d supply, fill #2

## 2021-09-06 ENCOUNTER — Other Ambulatory Visit (HOSPITAL_BASED_OUTPATIENT_CLINIC_OR_DEPARTMENT_OTHER): Payer: Self-pay

## 2021-10-12 ENCOUNTER — Other Ambulatory Visit (HOSPITAL_BASED_OUTPATIENT_CLINIC_OR_DEPARTMENT_OTHER): Payer: Self-pay

## 2021-11-10 ENCOUNTER — Other Ambulatory Visit (HOSPITAL_BASED_OUTPATIENT_CLINIC_OR_DEPARTMENT_OTHER): Payer: Self-pay

## 2021-11-10 MED ORDER — MOUNJARO 10 MG/0.5ML ~~LOC~~ SOAJ
10.0000 mg | SUBCUTANEOUS | 2 refills | Status: DC
  Filled 2021-11-10: qty 2, 28d supply, fill #0
  Filled 2021-12-04: qty 2, 28d supply, fill #1
  Filled 2022-01-02: qty 2, 28d supply, fill #2

## 2021-12-06 ENCOUNTER — Other Ambulatory Visit (HOSPITAL_BASED_OUTPATIENT_CLINIC_OR_DEPARTMENT_OTHER): Payer: Self-pay

## 2022-01-03 ENCOUNTER — Other Ambulatory Visit (HOSPITAL_BASED_OUTPATIENT_CLINIC_OR_DEPARTMENT_OTHER): Payer: Self-pay

## 2022-01-05 ENCOUNTER — Other Ambulatory Visit (HOSPITAL_BASED_OUTPATIENT_CLINIC_OR_DEPARTMENT_OTHER): Payer: Self-pay

## 2022-01-25 ENCOUNTER — Other Ambulatory Visit (HOSPITAL_BASED_OUTPATIENT_CLINIC_OR_DEPARTMENT_OTHER): Payer: Self-pay

## 2022-01-25 MED ORDER — MOUNJARO 10 MG/0.5ML ~~LOC~~ SOAJ
SUBCUTANEOUS | 2 refills | Status: AC
  Filled 2022-01-25: qty 2, 28d supply, fill #0
  Filled 2022-02-19 – 2022-03-04 (×5): qty 2, 28d supply, fill #1

## 2022-02-21 ENCOUNTER — Other Ambulatory Visit (HOSPITAL_BASED_OUTPATIENT_CLINIC_OR_DEPARTMENT_OTHER): Payer: Self-pay

## 2022-02-23 ENCOUNTER — Other Ambulatory Visit (HOSPITAL_BASED_OUTPATIENT_CLINIC_OR_DEPARTMENT_OTHER): Payer: Self-pay

## 2022-02-25 ENCOUNTER — Other Ambulatory Visit (HOSPITAL_BASED_OUTPATIENT_CLINIC_OR_DEPARTMENT_OTHER): Payer: Self-pay

## 2022-02-28 ENCOUNTER — Other Ambulatory Visit (HOSPITAL_BASED_OUTPATIENT_CLINIC_OR_DEPARTMENT_OTHER): Payer: Self-pay

## 2022-03-04 ENCOUNTER — Other Ambulatory Visit (HOSPITAL_BASED_OUTPATIENT_CLINIC_OR_DEPARTMENT_OTHER): Payer: Self-pay

## 2022-03-14 ENCOUNTER — Other Ambulatory Visit (HOSPITAL_BASED_OUTPATIENT_CLINIC_OR_DEPARTMENT_OTHER): Payer: Self-pay

## 2022-03-14 MED ORDER — MOUNJARO 12.5 MG/0.5ML ~~LOC~~ SOAJ
SUBCUTANEOUS | 2 refills | Status: DC
  Filled 2022-03-14: qty 2, 30d supply, fill #0
  Filled 2022-03-30: qty 2, 28d supply, fill #0
  Filled 2022-04-27: qty 2, 28d supply, fill #1
  Filled 2022-05-26: qty 2, 28d supply, fill #2

## 2022-03-29 ENCOUNTER — Other Ambulatory Visit (HOSPITAL_BASED_OUTPATIENT_CLINIC_OR_DEPARTMENT_OTHER): Payer: Self-pay

## 2022-03-30 ENCOUNTER — Other Ambulatory Visit (HOSPITAL_BASED_OUTPATIENT_CLINIC_OR_DEPARTMENT_OTHER): Payer: Self-pay

## 2022-04-27 ENCOUNTER — Other Ambulatory Visit (HOSPITAL_BASED_OUTPATIENT_CLINIC_OR_DEPARTMENT_OTHER): Payer: Self-pay

## 2022-05-26 ENCOUNTER — Other Ambulatory Visit (HOSPITAL_BASED_OUTPATIENT_CLINIC_OR_DEPARTMENT_OTHER): Payer: Self-pay

## 2022-06-15 ENCOUNTER — Other Ambulatory Visit (HOSPITAL_BASED_OUTPATIENT_CLINIC_OR_DEPARTMENT_OTHER): Payer: Self-pay

## 2022-06-15 MED ORDER — MOUNJARO 12.5 MG/0.5ML ~~LOC~~ SOAJ
12.5000 mg | SUBCUTANEOUS | 3 refills | Status: DC
  Filled 2022-06-15 – 2022-06-16 (×2): qty 2, 28d supply, fill #0
  Filled 2022-07-20: qty 2, 28d supply, fill #1
  Filled 2022-08-18: qty 2, 28d supply, fill #2
  Filled 2022-09-16: qty 2, 28d supply, fill #3

## 2022-06-16 ENCOUNTER — Other Ambulatory Visit (HOSPITAL_BASED_OUTPATIENT_CLINIC_OR_DEPARTMENT_OTHER): Payer: Self-pay

## 2022-06-16 MED ORDER — TESTOSTERONE CYPIONATE 200 MG/ML IM SOLN
100.0000 mg | INTRAMUSCULAR | 0 refills | Status: DC
  Filled 2022-06-16: qty 2, 30d supply, fill #0
  Filled 2022-07-08: qty 4, 112d supply, fill #0
  Filled 2022-07-21: qty 4, 56d supply, fill #0

## 2022-06-17 ENCOUNTER — Other Ambulatory Visit (HOSPITAL_BASED_OUTPATIENT_CLINIC_OR_DEPARTMENT_OTHER): Payer: Self-pay

## 2022-06-20 ENCOUNTER — Other Ambulatory Visit (HOSPITAL_BASED_OUTPATIENT_CLINIC_OR_DEPARTMENT_OTHER): Payer: Self-pay

## 2022-06-24 ENCOUNTER — Other Ambulatory Visit (HOSPITAL_BASED_OUTPATIENT_CLINIC_OR_DEPARTMENT_OTHER): Payer: Self-pay

## 2022-06-27 ENCOUNTER — Other Ambulatory Visit (HOSPITAL_BASED_OUTPATIENT_CLINIC_OR_DEPARTMENT_OTHER): Payer: Self-pay

## 2022-06-28 ENCOUNTER — Other Ambulatory Visit (HOSPITAL_BASED_OUTPATIENT_CLINIC_OR_DEPARTMENT_OTHER): Payer: Self-pay

## 2022-06-29 ENCOUNTER — Other Ambulatory Visit (HOSPITAL_BASED_OUTPATIENT_CLINIC_OR_DEPARTMENT_OTHER): Payer: Self-pay

## 2022-07-01 ENCOUNTER — Other Ambulatory Visit (HOSPITAL_BASED_OUTPATIENT_CLINIC_OR_DEPARTMENT_OTHER): Payer: Self-pay

## 2022-07-04 IMAGING — DX DG KNEE COMPLETE 4+V*R*
4 series · 4 of 4 positions shown · non-contrast
Comparison: None.

CLINICAL DATA: Right knee pain.  No known injury.

EXAM:
RIGHT KNEE - COMPLETE 4+ VIEW

[knee ap]
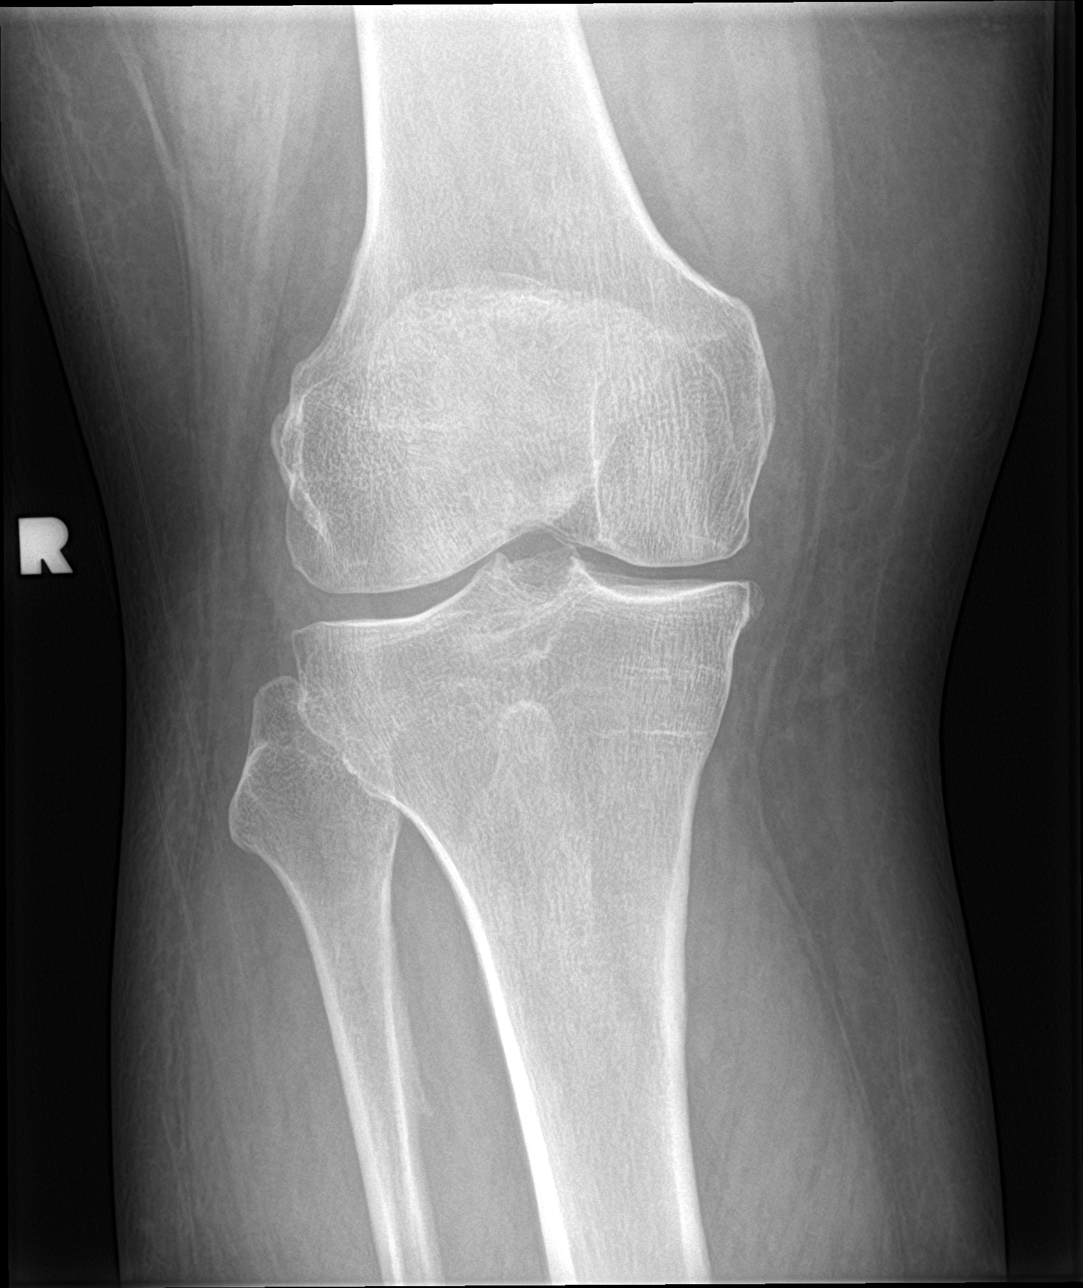

[knee lat]
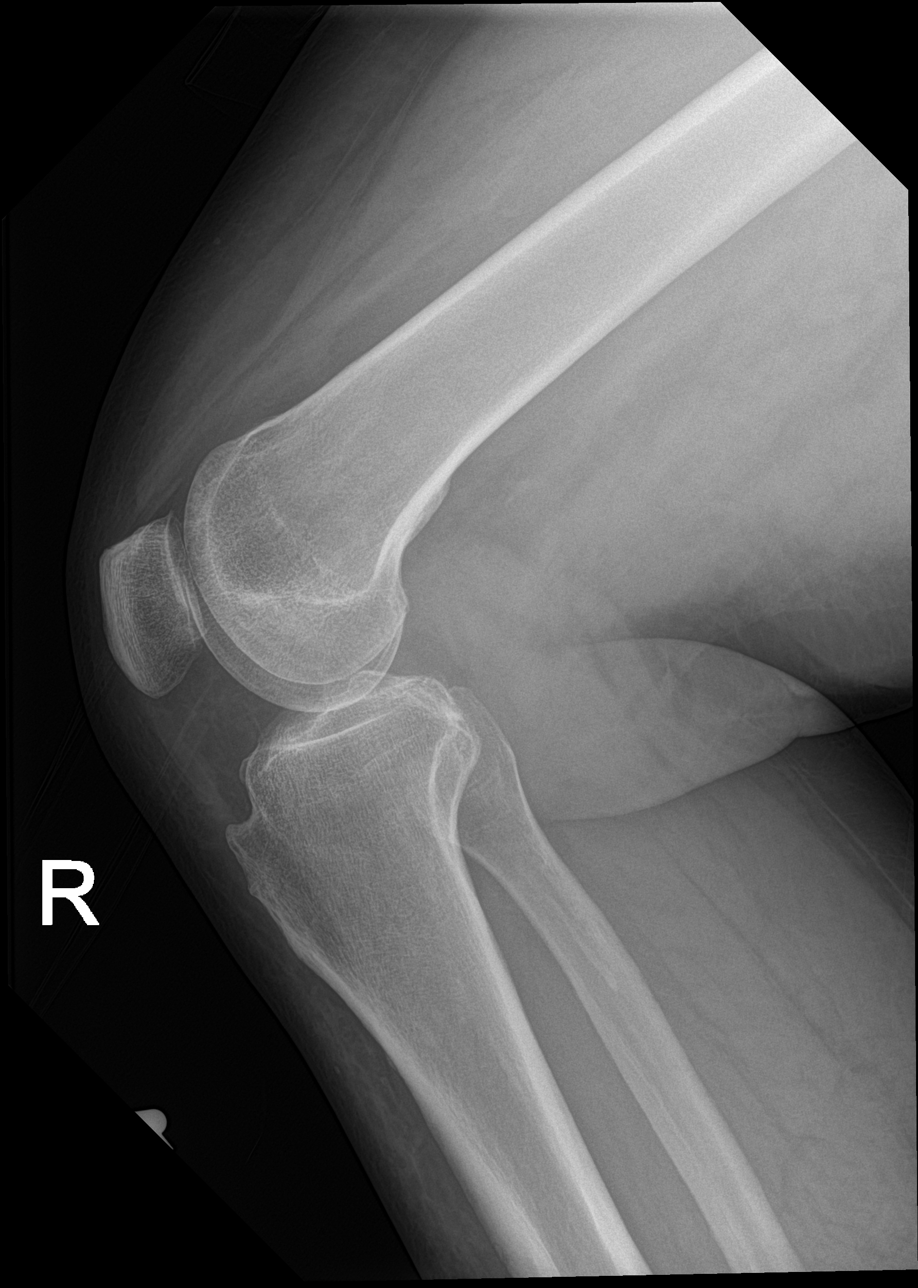

[knee obl (1 of 2)]
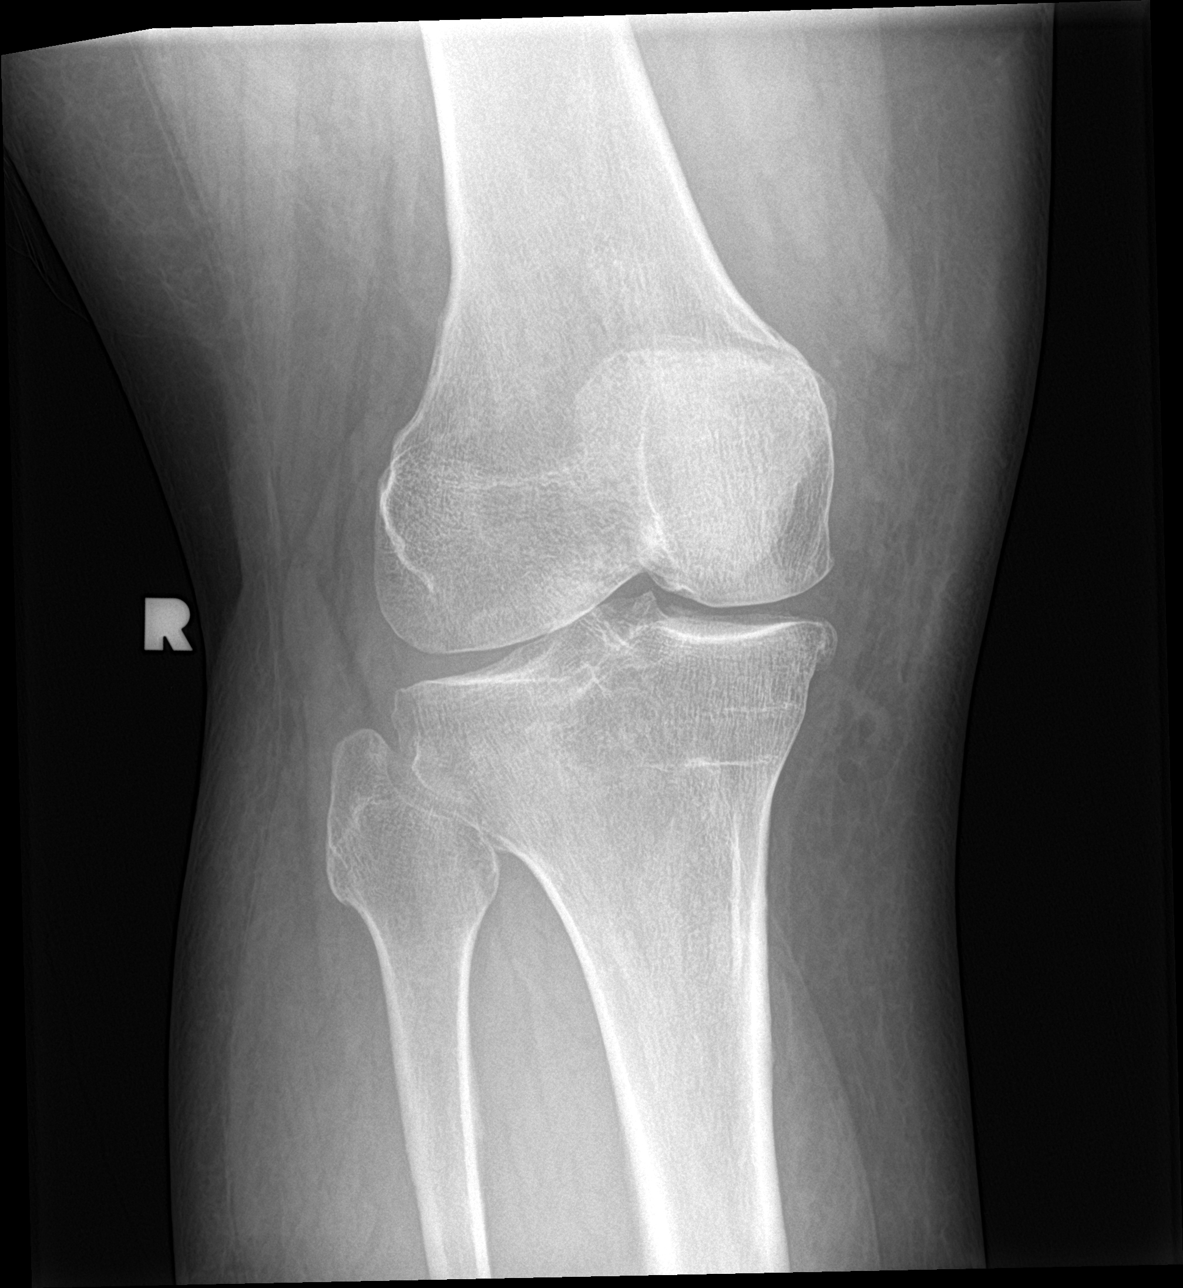

[knee obl (2 of 2)]
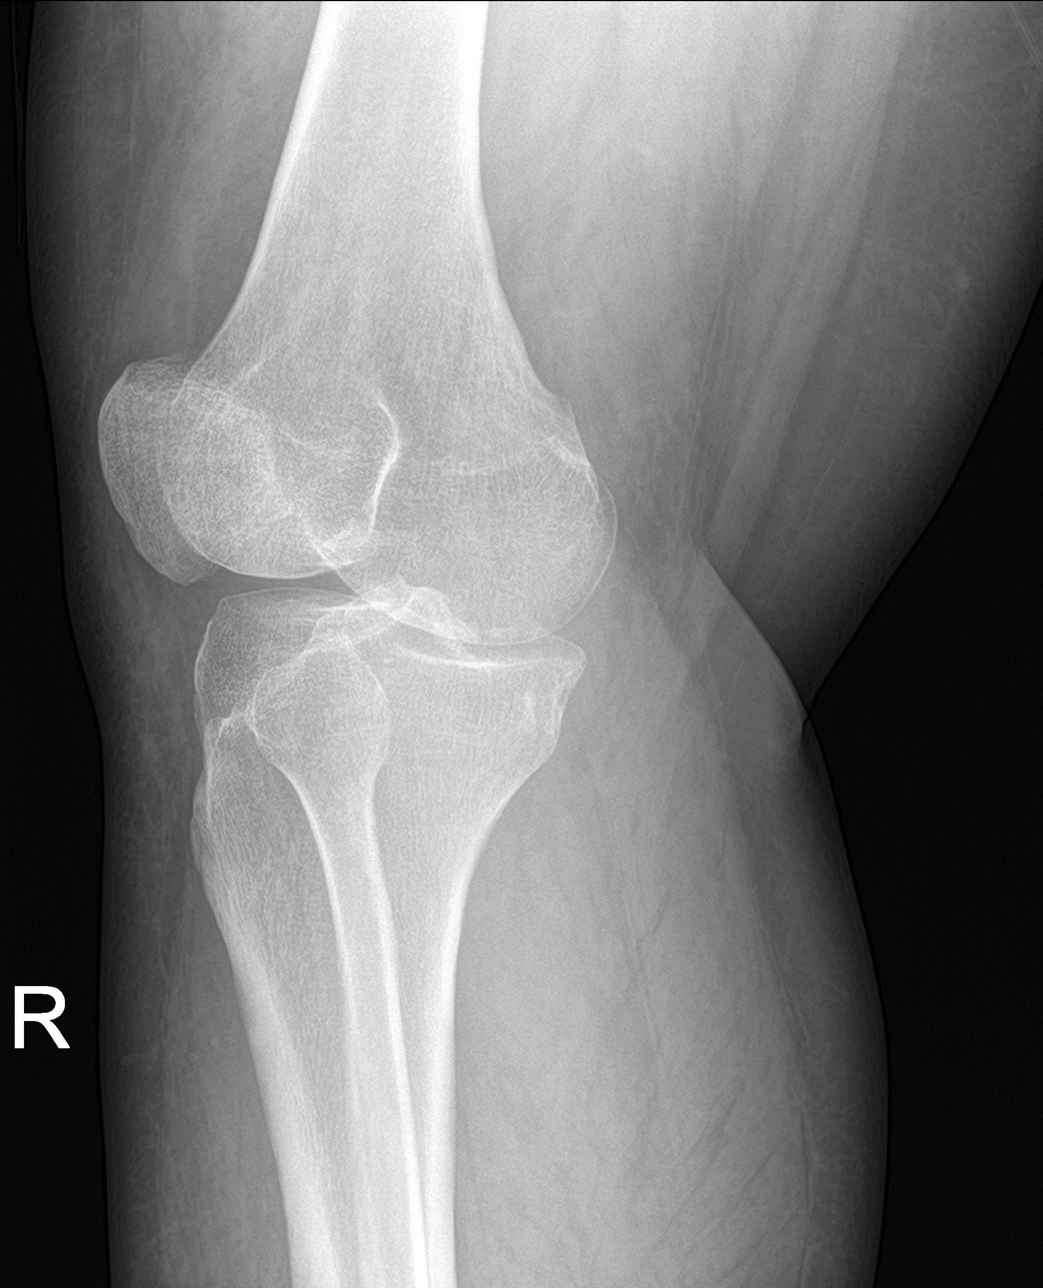

[4 of 4 positions shown; findings below may reference images not displayed]

FINDINGS: No evidence of fracture, dislocation, or joint effusion. Mild medial
tibiofemoral joint space narrowing. Minimal spurring of the tibial
spines and medial tibiofemoral compartment. No erosion or focal bone
abnormality. Soft tissues are unremarkable.
IMPRESSION: Mild degenerative change most prominent in the medial tibiofemoral
compartment. No acute findings.

## 2022-07-08 ENCOUNTER — Other Ambulatory Visit (HOSPITAL_BASED_OUTPATIENT_CLINIC_OR_DEPARTMENT_OTHER): Payer: Self-pay

## 2022-07-20 ENCOUNTER — Other Ambulatory Visit (HOSPITAL_BASED_OUTPATIENT_CLINIC_OR_DEPARTMENT_OTHER): Payer: Self-pay

## 2022-07-21 ENCOUNTER — Other Ambulatory Visit (HOSPITAL_BASED_OUTPATIENT_CLINIC_OR_DEPARTMENT_OTHER): Payer: Self-pay

## 2022-08-18 ENCOUNTER — Other Ambulatory Visit (HOSPITAL_BASED_OUTPATIENT_CLINIC_OR_DEPARTMENT_OTHER): Payer: Self-pay

## 2022-09-16 ENCOUNTER — Other Ambulatory Visit (HOSPITAL_BASED_OUTPATIENT_CLINIC_OR_DEPARTMENT_OTHER): Payer: Self-pay

## 2022-09-20 ENCOUNTER — Other Ambulatory Visit (HOSPITAL_BASED_OUTPATIENT_CLINIC_OR_DEPARTMENT_OTHER): Payer: Self-pay

## 2022-09-20 MED ORDER — TESTOSTERONE CYPIONATE 200 MG/ML IM SOLN
100.0000 mg | INTRAMUSCULAR | 0 refills | Status: AC
  Filled 2022-09-20: qty 3, 84d supply, fill #0

## 2022-10-14 ENCOUNTER — Other Ambulatory Visit (HOSPITAL_BASED_OUTPATIENT_CLINIC_OR_DEPARTMENT_OTHER): Payer: Self-pay

## 2022-10-14 MED ORDER — MOUNJARO 12.5 MG/0.5ML ~~LOC~~ SOAJ
12.5000 mg | SUBCUTANEOUS | 3 refills | Status: AC
  Filled 2022-10-14: qty 2, 28d supply, fill #0

## 2022-10-17 ENCOUNTER — Other Ambulatory Visit (HOSPITAL_BASED_OUTPATIENT_CLINIC_OR_DEPARTMENT_OTHER): Payer: Self-pay
